# Patient Record
Sex: Male | Born: 2005 | Race: Black or African American | Hispanic: No | Marital: Single | Smoking: Never smoker
Health system: Southern US, Community
[De-identification: ages and names within clinical notes are randomized; demographics above are authoritative.]

## PROBLEM LIST (undated history)

## (undated) DIAGNOSIS — T7840XA Allergy, unspecified, initial encounter: Secondary | ICD-10-CM

## (undated) HISTORY — PX: DENTAL TRAUMA REPAIR (TOOTH REIMPLANTATION): SHX1450

## (undated) HISTORY — DX: Allergy, unspecified, initial encounter: T78.40XA

---
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
## 2024-06-23 ENCOUNTER — Encounter (HOSPITAL_BASED_OUTPATIENT_CLINIC_OR_DEPARTMENT_OTHER): Payer: Self-pay

## 2024-06-23 ENCOUNTER — Emergency Department (HOSPITAL_BASED_OUTPATIENT_CLINIC_OR_DEPARTMENT_OTHER)

## 2024-06-23 ENCOUNTER — Other Ambulatory Visit: Payer: Self-pay

## 2024-06-23 ENCOUNTER — Emergency Department (HOSPITAL_BASED_OUTPATIENT_CLINIC_OR_DEPARTMENT_OTHER)
Admission: EM | Admit: 2024-06-23 | Discharge: 2024-06-23 | Disposition: A | Discharge status: Home / Self Care | Attending: Emergency Medicine | Admitting: Emergency Medicine

## 2024-06-23 DIAGNOSIS — M25551 Pain in right hip: Secondary | ICD-10-CM | POA: Diagnosis present

## 2024-06-23 MED ORDER — DICLOFENAC SODIUM ER 100 MG PO TB24: 100.0000 mg | ORAL_TABLET | Freq: Every day | ORAL | 0 refills | Status: AC

## 2024-06-23 NOTE — Discharge Instructions (Signed)
 Take diclofenac  daily as prescribed with food.  Discontinue if you develop abdominal pain. Recommend discussing your injury with your employer and follow-up with their Worker's Comp. provider in 2 days for recheck.  Provided with referral to orthopedics although you may need to start with the Worker's Comp. provider.

## 2024-06-23 NOTE — ED Provider Notes (Signed)
 [name] Provider Note   CSN: [id number] Arrival date & time: 06/23/24  9064     Patient presents with: Hip Pain   Damon [name] is a 18 y.o. male.   18 year old male presents for evaluation of pain in his lateral right hip area that started on Friday last week at the end of his shift where he routinely lifts between 70 and 145 pounds.  He denies any falls, twists or injuries.  No prior hip problems.  Patient has been trying to rest the hip and to take over-the-counter pain reliever without improvement in his symptoms.  Denies any clicking, popping, snapping.  He denies any overlying rashes, redness. Denies history of gonorrhea, discharge or STI concerns.       Prior to Admission medications   Medication Sig Start Date End Date Taking? Authorizing Provider  Diclofenac  Sodium CR 100 MG 24 hr tablet Take 1 tablet (100 mg total) by mouth daily for 10 days. 06/23/24 07/03/24 Yes Beverley Leita LABOR, PA-C  cetirizine HCl (ZYRTEC) 5 MG/5ML SOLN Take by mouth. 03/28/21   [provider]  Clindamycin-Benzoyl Per, Refr, gel Apply topically daily. 06/12/20   [provider]  FLOVENT HFA 44 MCG/ACT inhaler Inhale 2 puffs into the lungs daily at 6 (six) AM. 01/20/18   [provider]  fluticasone (FLONASE) 50 MCG/ACT nasal spray Place 2 sprays into both nostrils daily. 05/03/18   [provider]  PROAIR HFA 108 [phone] Base) MCG/ACT inhaler  01/21/18   [provider]  Vitamin D , Ergocalciferol , (DRISDOL ) 1.25 MG (50000 UNIT) CAPS capsule TAKE 1 CAPSULE BY MOUTH EVERY 7 DAYS 09/28/22   Dorrene Nest, MD    Allergies: Ft vapor inhaler [aromatic inhalants]    Review of Systems Negative except as per HPI Updated Vital Signs BP (!) 142/89 (BP Location: Left Arm)   Pulse 95   Temp 98.3 F (36.8 C) (Oral)   Resp 18   Ht 5' 11 (1.803 m)   Wt 95.3 kg   SpO2 99%   BMI 29.29 kg/m   Physical Exam Vitals and nursing  note reviewed.  Constitutional:      General: He is not in acute distress.    Appearance: He is well-developed. He is not diaphoretic.  HENT:     Head: Normocephalic and atraumatic.  Cardiovascular:     Pulses: Normal pulses.  Pulmonary:     Effort: Pulmonary effort is normal.  Musculoskeletal:        General: No swelling, tenderness or deformity. Normal range of motion.     Lumbar back: No tenderness or bony tenderness. Negative right straight leg raise test and negative left straight leg raise test.     Right hip: No deformity, tenderness, bony tenderness or crepitus. Normal range of motion. Normal strength.  Skin:    General: Skin is warm and dry.     Findings: No erythema or rash.  Neurological:     Mental Status: He is alert and oriented to person, place, and time.     Sensory: No sensory deficit.     Motor: No weakness.     Gait: Gait normal.  Psychiatric:        Behavior: Behavior normal.     (all labs ordered are listed, but only abnormal results are displayed) Labs Reviewed - No data to display  EKG: None  Radiology: DG Hip Unilat  With Pelvis 2-3 Views Right Result Date: 06/23/2024 CLINICAL DATA:  Right  hip pain 1 week. No injury. States hip gave out while lifting something. EXAM: DG HIP (WITH OR WITHOUT PELVIS) 2-3V RIGHT COMPARISON:  None Available. FINDINGS: Bones, joint spaces and soft tissues over the hips are normal. Remaining pelvic bones are unremarkable. Soft tissues are normal. IMPRESSION: Negative. Electronically Signed   By: Toribio Agreste M.D.   On: 06/23/2024 10:23     Procedures   Medications Ordered in the ED - No data to display                                  Medical Decision Making Amount and/or Complexity of Data Reviewed Radiology: ordered.   18 year old male with complaint of pain in the lateral right hip after working his shift last week where he does a lot of heavy lifting, no reports of injury otherwise or chronic hip pain. Exam is  unremarkable, there is no tenderness with palpation over the bursa.  Denies STI concerns, do not suspect gonococcal arthritis.  X-ray of the right hip as ordered by self is negative for acute bony abnormality although question shallow acetabulum.  Agree with radiology interpretation.  Patient is referred to orthopedics although recommend he discuss with his employer and follow-up with Worker's Comp. provider.  Provided with 10-day course of NSAID.     Final diagnoses:  Right hip pain    ED Discharge Orders          Ordered    Diclofenac  Sodium CR 100 MG 24 hr tablet  Daily        06/23/24 1036               Beverley Leita DELENA DEVONNA 06/23/24 1040    Armenta Canning, MD 06/29/24 [phone]

## 2024-06-23 NOTE — ED Triage Notes (Signed)
 Pt reports that he has right hip pain x 1 week. Denies fall. States that he was lifting something heavy at work and his hip gave out.

## 2024-06-23 NOTE — ED Notes (Signed)

## 2024-07-11 ENCOUNTER — Ambulatory Visit (INDEPENDENT_AMBULATORY_CARE_PROVIDER_SITE_OTHER): Admitting: Orthopaedic Surgery

## 2024-07-11 DIAGNOSIS — M545 Low back pain, unspecified: Secondary | ICD-10-CM | POA: Diagnosis not present

## 2024-07-11 MED ORDER — IBUPROFEN 800 MG PO TABS: 800.0000 mg | ORAL_TABLET | Freq: Three times a day (TID) | ORAL | 2 refills | Status: AC | PRN

## 2024-07-11 NOTE — Progress Notes (Signed)
 Office Visit Note   Patient: Damon [name]           Date of Birth: 2006-07-04           MRN: [id number] Visit Date: 07/11/2024              Requested by: Viktoria Norris, MD [address] [place],  [place] [address] PCP: Viktoria Norris, MD   Assessment & Plan: Visit Diagnoses:  1. Acute right-sided low back pain without sciatica     Plan: History of Present Illness Damon [name] is a 18 year old male who presents with right hip pain.  He has experienced right hip pain since June 23, 2024, localized to the lower back and buttock area, with a tingling sensation. The pain is present even at rest, such as when lying down, and is not related to exertion. There is no radiation of pain down the leg and no numbness.  He takes muscle relaxers after work, which provide some relief, especially on weekends. Topical treatments like Icy Hot are used, but his effect diminishes by 7 PM, leading to a recurrence of pain. Ibuprofen  is used on weekends with some benefit.  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  Physical Exam MUSCULOSKELETAL: No tenderness over right hip bursa. No pain on internal and external rotation of right hip. No pain on active straight leg raise of right hip. No pain on passive rotation of right hip. Stretching sensation in right hip. Pain elicited on specific maneuver of right hip. No tenderness in right hip area.  Results RADIOLOGY Hip X-ray: Normal  Assessment and Plan Right buttock and lower back pain, possible sacroiliac joint or lumbar strain Pain likely due to overuse injury, possibly sacroiliac joint or lumbar strain. X-rays normal. - Refer to physical therapy. - Prescribe anti-inflammatory medication. - Discuss work restrictions if necessary.  Follow-Up Instructions: No follow-ups on file.   Orders:  Orders Placed This Encounter  Procedures   Ambulatory referral to Physical Therapy   Meds ordered this encounter   Medications   ibuprofen  (ADVIL ) 800 MG tablet    Sig: Take 1 tablet (800 mg total) by mouth every 8 (eight) hours as needed.    Dispense:  30 tablet    Refill:  2      Procedures: No procedures performed   Clinical Data: No additional findings.   Subjective: Chief Complaint  Patient presents with   Right Hip - Pain    HPI  Review of Systems  Constitutional: Negative.   HENT: Negative.    Eyes: Negative.   Respiratory: Negative.    Cardiovascular: Negative.   Gastrointestinal: Negative.   Endocrine: Negative.   Genitourinary: Negative.   Skin: Negative.   Allergic/Immunologic: Negative.   Neurological: Negative.   Hematological: Negative.   Psychiatric/Behavioral: Negative.    All other systems reviewed and are negative.    Objective: Vital Signs: There were no vitals taken for this visit.  Physical Exam Vitals and nursing note reviewed.  Constitutional:      Appearance: He is well-developed.  HENT:     Head: Normocephalic and atraumatic.  Eyes:     Pupils: Pupils are equal, round, and reactive to light.  Pulmonary:     Effort: Pulmonary effort is normal.  Abdominal:     Palpations: Abdomen is soft.  Musculoskeletal:        General: Normal range of motion.     Cervical back: Neck supple.  Skin:  General: Skin is warm.  Neurological:     Mental Status: He is alert and oriented to person, place, and time.  Psychiatric:        Behavior: Behavior normal.        Thought Content: Thought content normal.        Judgment: Judgment normal.     Ortho Exam  Specialty Comments:  No specialty comments available.  Imaging: No results found.   PMFS History: Patient Active Problem List   Diagnosis Date Noted   Bleeding from the nose 09/30/2023   S/P tonsillectomy 09/30/2023   Obesity 09/30/2023   Back pain 03/31/2022   Body mass index (BMI) pediatric, 95th percentile for age to less than 120% of the 95th percentile for age 37/07/2021   Acne  10/24/2021   Sever's apophysitis, bilateral 10/24/2021   Dietary counseling 10/24/2021   Encounter for immunization 10/24/2021   Exercise counseling 10/24/2021   Well child check 10/24/2021   Right-sided epistaxis 01/28/2021   Childhood obesity 07/12/2020   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   Blood glucose abnormal 10/16/2019   Allergic rhinitis 10/16/2019   Elevated blood-pressure reading without diagnosis of hypertension 10/16/2019   Pure hypercholesterolemia 10/16/2019   Snoring 10/16/2019   Vitamin D  deficiency 10/16/2019   Maladaptive health behaviors affecting medical condition 12/20/2018   Nasal turbinate hypertrophy 01/31/2018   Prediabetes 01/27/2018   Severe obesity with body mass index (BMI) greater than 99th percentile 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 (HCC) 01/27/2018   Acanthosis 01/27/2018   Pure hypertriglyceridemia 01/27/2018   Hypovitaminosis D 01/27/2018   Past Medical History:  Diagnosis Date   Allergy     Family History  Problem Relation Age of Onset   Healthy Mother    Asthma Father    Diabetes Maternal Grandmother    Hypertension Maternal Grandmother    Hypertension Paternal Grandmother    Diabetes Paternal Grandmother    Hyperlipidemia Paternal Grandmother    Hypertension Paternal Grandfather    Hyperlipidemia Paternal Grandfather     Past Surgical History:  Procedure Laterality Date   DENTAL TRAUMA REPAIR (TOOTH REIMPLANTATION)     unsure of the year but around age 71-7   Social History   Occupational History   Not on file  Tobacco Use   Smoking status: Never    Passive exposure: Yes   Smokeless tobacco: Never   Tobacco comments:    mom smokes inside  Vaping Use   Vaping status: Never Used  Substance and Sexual Activity   Alcohol use: Never   Drug use: Never   Sexual activity: Not Currently

## 2024-09-18 ENCOUNTER — Encounter: Payer: Self-pay | Admitting: Radiology
